# Patient Record
Sex: Female | Born: 1960 | Race: Black or African American | Hispanic: No | Marital: Single
Health system: Southern US, Community
[De-identification: ages and names within clinical notes are randomized; demographics above are authoritative.]

---

## 1999-01-07 ENCOUNTER — Ambulatory Visit (HOSPITAL_COMMUNITY): Admission: RE | Admit: 1999-01-07 | Discharge: 1999-01-07 | Payer: Self-pay | Admitting: Obstetrics and Gynecology

## 1999-01-07 ENCOUNTER — Encounter: Payer: Self-pay | Admitting: Obstetrics and Gynecology

## 1999-01-20 ENCOUNTER — Other Ambulatory Visit: Admission: RE | Admit: 1999-01-20 | Discharge: 1999-01-20 | Payer: Self-pay | Admitting: Obstetrics and Gynecology

## 2000-01-20 ENCOUNTER — Ambulatory Visit (HOSPITAL_COMMUNITY): Admission: RE | Admit: 2000-01-20 | Discharge: 2000-01-20 | Payer: Self-pay | Admitting: Obstetrics and Gynecology

## 2000-01-20 ENCOUNTER — Encounter: Payer: Self-pay | Admitting: Obstetrics and Gynecology

## 2000-01-20 ENCOUNTER — Other Ambulatory Visit: Admission: RE | Admit: 2000-01-20 | Discharge: 2000-01-20 | Payer: Self-pay | Admitting: Obstetrics and Gynecology

## 2001-01-20 ENCOUNTER — Other Ambulatory Visit: Admission: RE | Admit: 2001-01-20 | Discharge: 2001-01-20 | Payer: Self-pay | Admitting: *Deleted

## 2001-01-20 ENCOUNTER — Encounter: Payer: Self-pay | Admitting: Obstetrics and Gynecology

## 2001-01-20 ENCOUNTER — Ambulatory Visit (HOSPITAL_COMMUNITY): Admission: RE | Admit: 2001-01-20 | Discharge: 2001-01-20 | Payer: Self-pay | Admitting: Obstetrics and Gynecology

## 2001-07-04 ENCOUNTER — Emergency Department (HOSPITAL_COMMUNITY): Admission: EM | Admit: 2001-07-04 | Discharge: 2001-07-04 | Payer: Self-pay | Admitting: Emergency Medicine

## 2002-01-20 ENCOUNTER — Other Ambulatory Visit: Admission: RE | Admit: 2002-01-20 | Discharge: 2002-01-20 | Payer: Self-pay | Admitting: Obstetrics and Gynecology

## 2002-01-20 ENCOUNTER — Ambulatory Visit (HOSPITAL_COMMUNITY): Admission: RE | Admit: 2002-01-20 | Discharge: 2002-01-20 | Payer: Self-pay | Admitting: Obstetrics and Gynecology

## 2002-01-20 ENCOUNTER — Encounter: Payer: Self-pay | Admitting: Obstetrics and Gynecology

## 2003-01-22 ENCOUNTER — Encounter: Payer: Self-pay | Admitting: Obstetrics and Gynecology

## 2003-01-22 ENCOUNTER — Ambulatory Visit (HOSPITAL_COMMUNITY): Admission: RE | Admit: 2003-01-22 | Discharge: 2003-01-22 | Payer: Self-pay | Admitting: Obstetrics and Gynecology

## 2003-01-29 ENCOUNTER — Other Ambulatory Visit: Admission: RE | Admit: 2003-01-29 | Discharge: 2003-01-29 | Payer: Self-pay | Admitting: Obstetrics and Gynecology

## 2004-01-21 ENCOUNTER — Other Ambulatory Visit: Admission: RE | Admit: 2004-01-21 | Discharge: 2004-01-21 | Payer: Self-pay | Admitting: Obstetrics & Gynecology

## 2004-01-25 ENCOUNTER — Ambulatory Visit (HOSPITAL_COMMUNITY): Admission: RE | Admit: 2004-01-25 | Discharge: 2004-01-25 | Payer: Self-pay | Admitting: Obstetrics and Gynecology

## 2004-02-12 ENCOUNTER — Encounter: Admission: RE | Admit: 2004-02-12 | Discharge: 2004-02-12 | Payer: Self-pay | Admitting: Obstetrics and Gynecology

## 2005-01-27 ENCOUNTER — Ambulatory Visit (HOSPITAL_COMMUNITY): Admission: RE | Admit: 2005-01-27 | Discharge: 2005-01-27 | Payer: Self-pay | Admitting: Obstetrics and Gynecology

## 2006-01-28 ENCOUNTER — Ambulatory Visit (HOSPITAL_COMMUNITY): Admission: RE | Admit: 2006-01-28 | Discharge: 2006-01-28 | Payer: Self-pay | Admitting: Obstetrics and Gynecology

## 2007-02-02 ENCOUNTER — Ambulatory Visit (HOSPITAL_COMMUNITY): Admission: RE | Admit: 2007-02-02 | Discharge: 2007-02-02 | Payer: Self-pay | Admitting: Obstetrics and Gynecology

## 2008-02-03 ENCOUNTER — Ambulatory Visit (HOSPITAL_COMMUNITY): Admission: RE | Admit: 2008-02-03 | Discharge: 2008-02-03 | Payer: Self-pay | Admitting: Obstetrics and Gynecology

## 2008-02-03 IMAGING — MG MM DIGITAL SCREENING BILAT
4 series · 4 of 4 positions shown · non-contrast
Comparison: none

DG SCREEN MAMMOGRAM BILATERAL
Bilateral CC and MLO view(s) were taken.

DIGITAL SCREENING MAMMOGRAM WITH CAD:
There are scattered fibroglandular densities.  No masses or malignant type calcifications are 
identified.  Compared with prior studies.

[R CC]
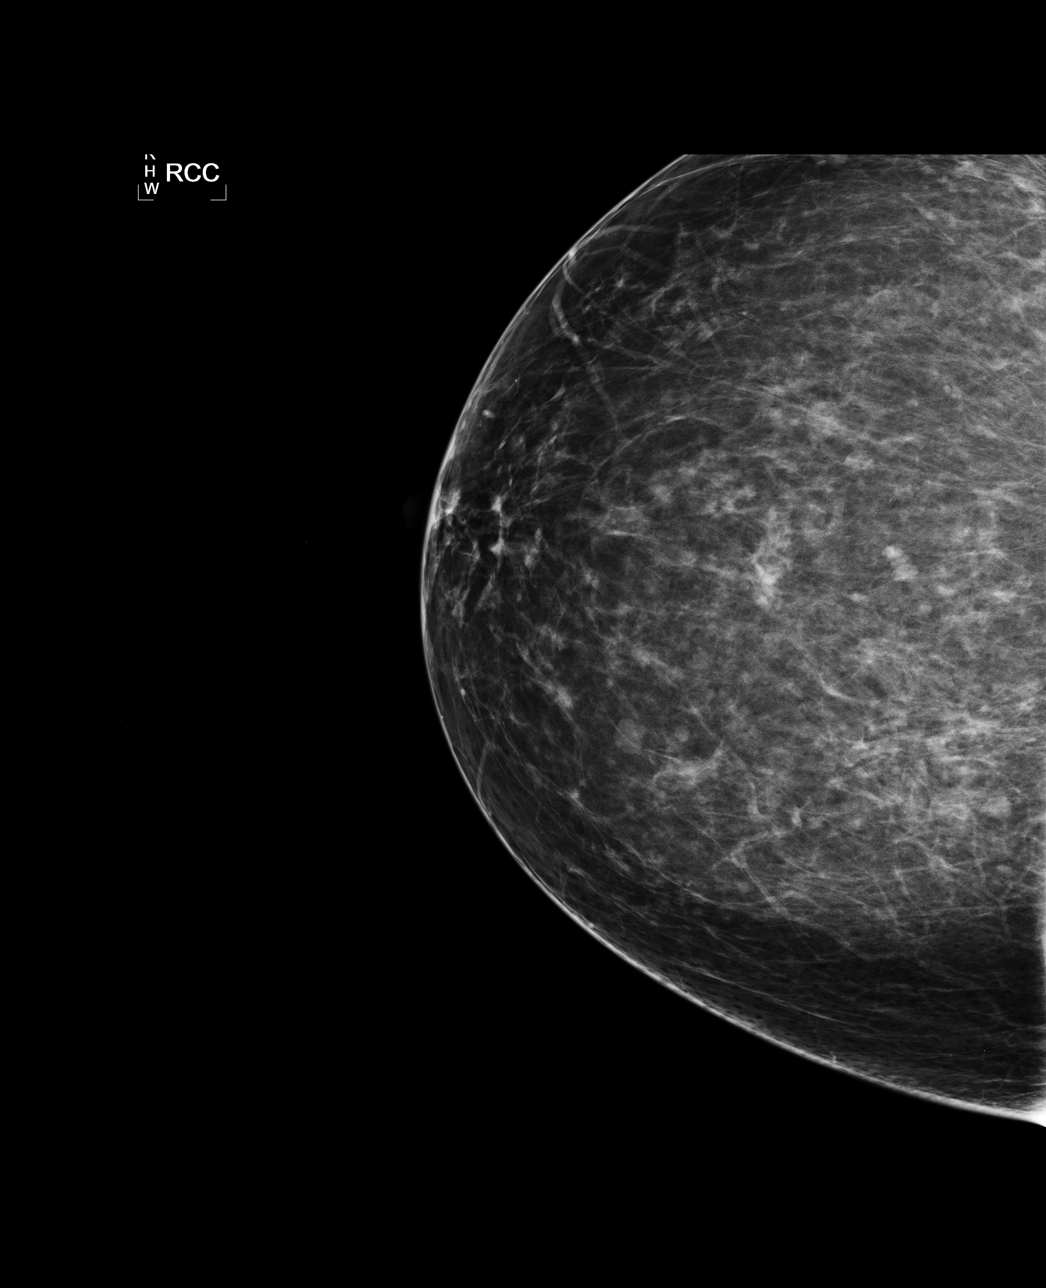

[R MLO]
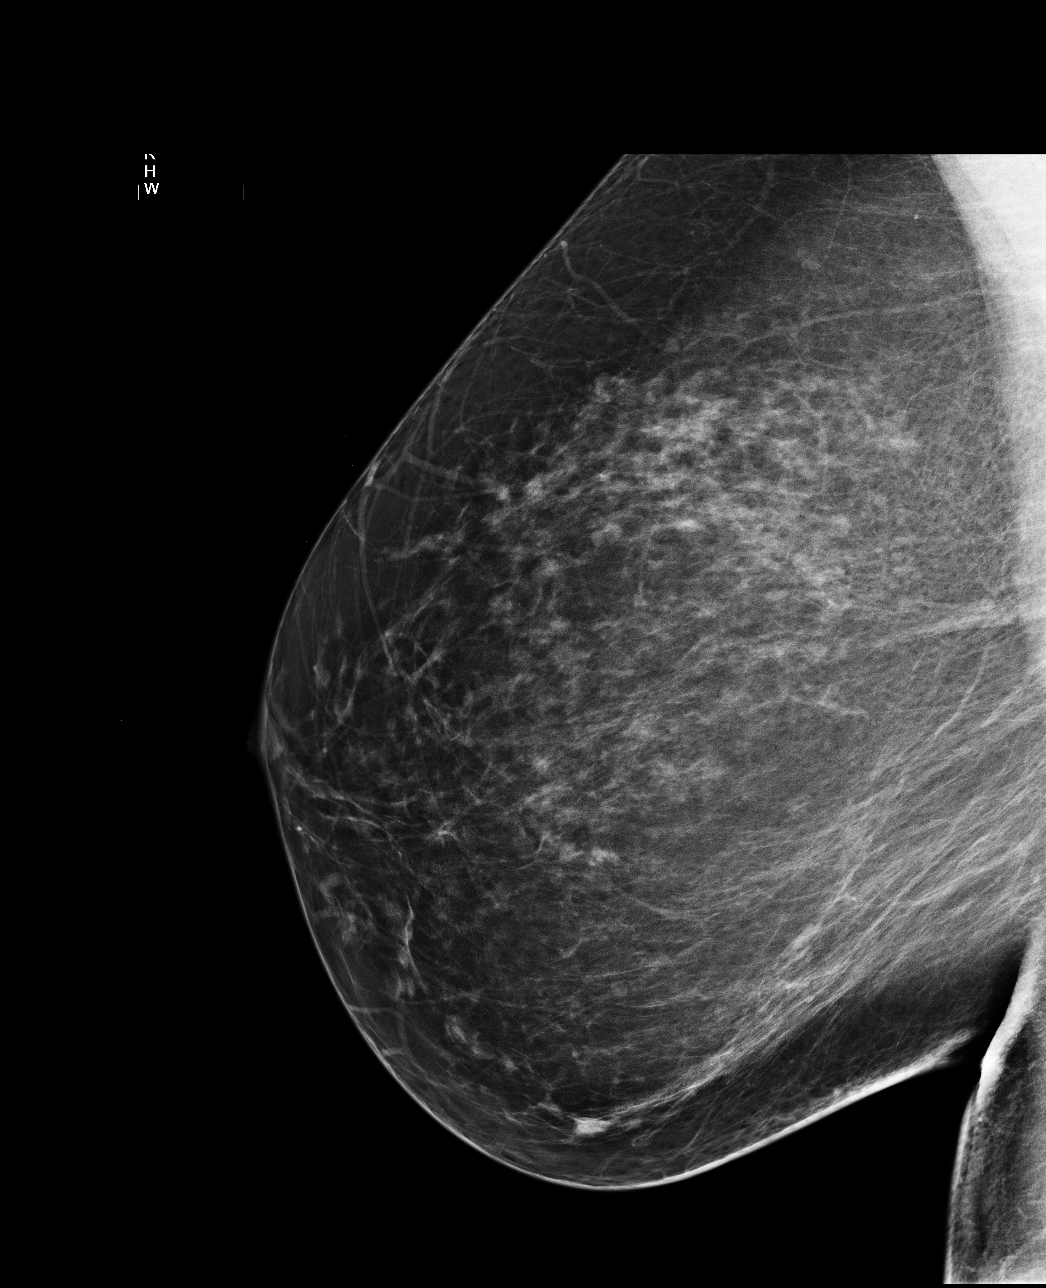

[L CC]
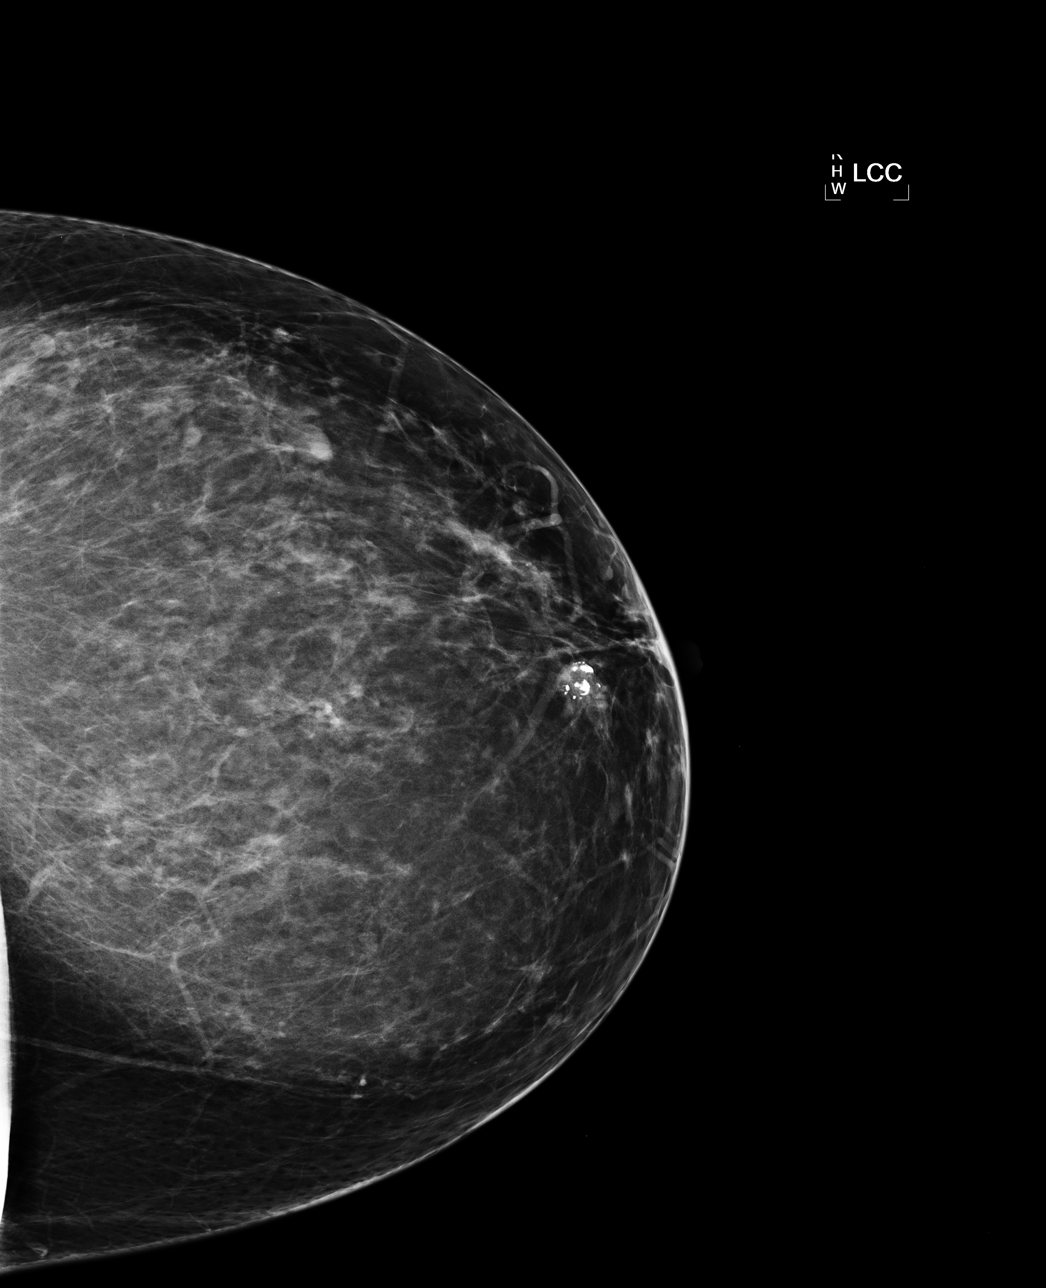

[L MLO]
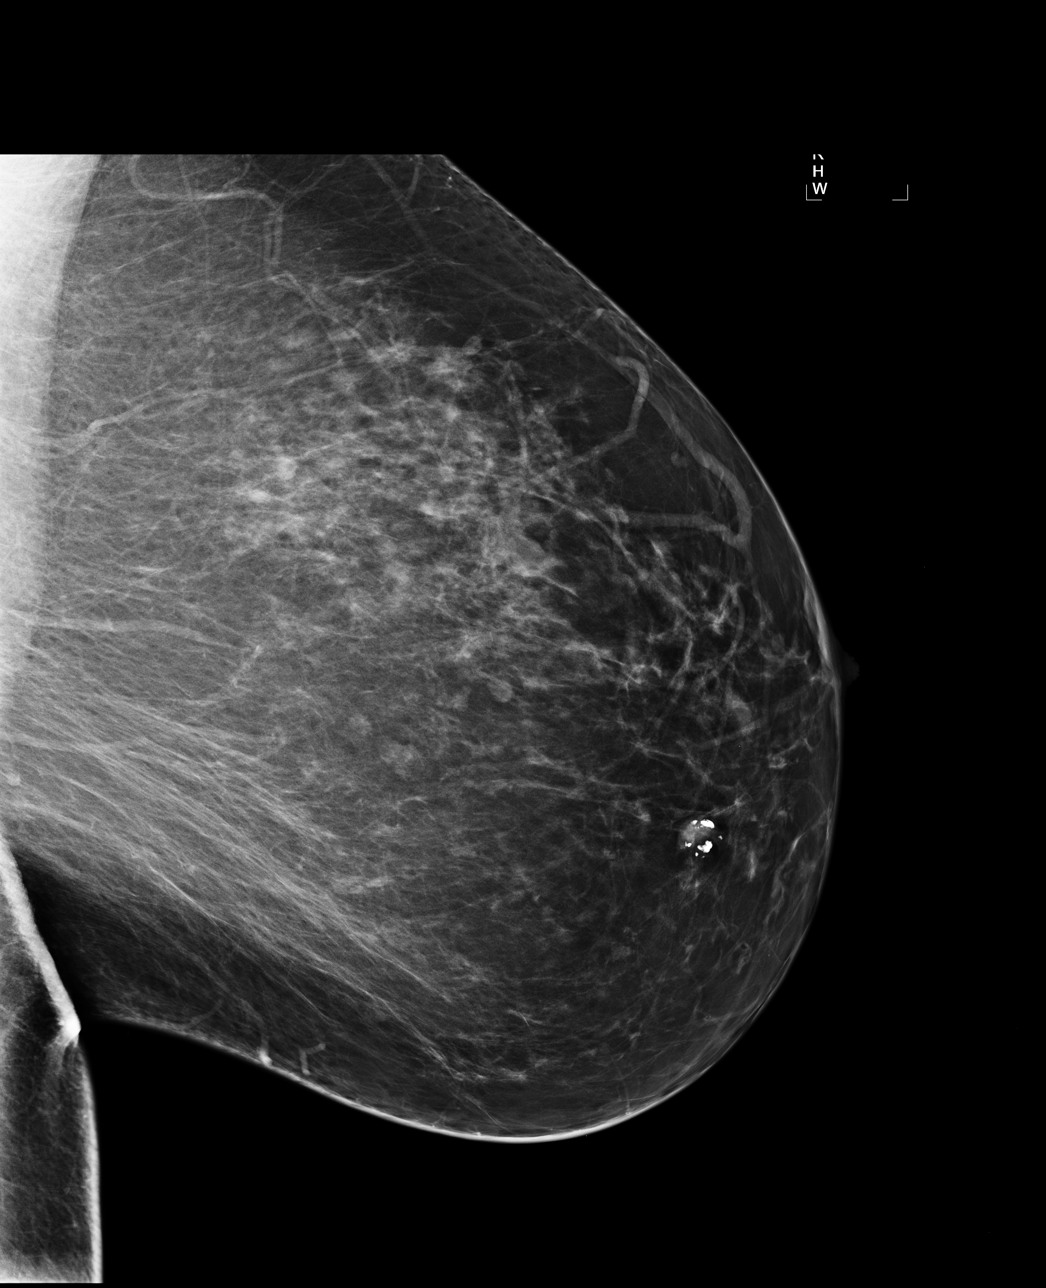

[4 of 4 positions shown; findings below may reference images not displayed]

IMPRESSION: No specific mammographic evidence of malignancy.  Next screening mammogram is recommended in one 
year.

ASSESSMENT: Negative - BI-RADS 1

Screening mammogram in 1 year.
ANALYZED BY COMPUTER AIDED DETECTION. , THIS PROCEDURE WAS A DIGITAL MAMMOGRAM.

## 2009-02-05 ENCOUNTER — Ambulatory Visit (HOSPITAL_COMMUNITY): Admission: RE | Admit: 2009-02-05 | Discharge: 2009-02-05 | Payer: Self-pay | Admitting: Obstetrics and Gynecology

## 2009-02-12 ENCOUNTER — Encounter: Admission: RE | Admit: 2009-02-12 | Discharge: 2009-02-12 | Payer: Self-pay | Admitting: Obstetrics and Gynecology

## 2009-08-09 ENCOUNTER — Encounter: Admission: RE | Admit: 2009-08-09 | Discharge: 2009-08-09 | Payer: Self-pay | Admitting: Obstetrics and Gynecology

## 2010-02-10 ENCOUNTER — Encounter: Admission: RE | Admit: 2010-02-10 | Discharge: 2010-02-10 | Payer: Self-pay | Admitting: Obstetrics and Gynecology

## 2010-03-26 ENCOUNTER — Encounter: Admission: RE | Admit: 2010-03-26 | Discharge: 2010-03-26 | Payer: Self-pay | Admitting: Obstetrics and Gynecology

## 2011-01-13 ENCOUNTER — Other Ambulatory Visit: Payer: Self-pay | Admitting: Obstetrics and Gynecology

## 2011-01-13 DIAGNOSIS — Z1231 Encounter for screening mammogram for malignant neoplasm of breast: Secondary | ICD-10-CM

## 2011-02-13 ENCOUNTER — Ambulatory Visit
Admission: RE | Admit: 2011-02-13 | Discharge: 2011-02-13 | Disposition: A | Discharge status: Home / Self Care | Payer: 59 | Source: Ambulatory Visit | Attending: Obstetrics and Gynecology | Admitting: Obstetrics and Gynecology

## 2011-02-13 DIAGNOSIS — Z1231 Encounter for screening mammogram for malignant neoplasm of breast: Secondary | ICD-10-CM

## 2012-02-02 ENCOUNTER — Other Ambulatory Visit: Payer: Self-pay | Admitting: Obstetrics and Gynecology

## 2012-02-02 DIAGNOSIS — Z1231 Encounter for screening mammogram for malignant neoplasm of breast: Secondary | ICD-10-CM

## 2012-02-24 ENCOUNTER — Ambulatory Visit
Admission: RE | Admit: 2012-02-24 | Discharge: 2012-02-24 | Disposition: A | Discharge status: Home / Self Care | Payer: 59 | Source: Ambulatory Visit | Attending: Obstetrics and Gynecology | Admitting: Obstetrics and Gynecology

## 2012-02-24 DIAGNOSIS — Z1231 Encounter for screening mammogram for malignant neoplasm of breast: Secondary | ICD-10-CM

## 2013-01-25 ENCOUNTER — Other Ambulatory Visit: Payer: Self-pay

## 2013-01-25 DIAGNOSIS — Z1231 Encounter for screening mammogram for malignant neoplasm of breast: Secondary | ICD-10-CM

## 2013-02-28 ENCOUNTER — Ambulatory Visit
Admission: RE | Admit: 2013-02-28 | Discharge: 2013-02-28 | Disposition: A | Discharge status: Home / Self Care | Payer: 59 | Source: Ambulatory Visit

## 2013-02-28 DIAGNOSIS — Z1231 Encounter for screening mammogram for malignant neoplasm of breast: Secondary | ICD-10-CM

## 2014-02-12 ENCOUNTER — Other Ambulatory Visit: Payer: Self-pay

## 2014-02-12 DIAGNOSIS — Z1231 Encounter for screening mammogram for malignant neoplasm of breast: Secondary | ICD-10-CM

## 2014-03-01 ENCOUNTER — Ambulatory Visit: Payer: 59

## 2014-03-08 ENCOUNTER — Ambulatory Visit
Admission: RE | Admit: 2014-03-08 | Discharge: 2014-03-08 | Disposition: A | Discharge status: Home / Self Care | Payer: 59 | Source: Ambulatory Visit

## 2014-03-08 DIAGNOSIS — Z1231 Encounter for screening mammogram for malignant neoplasm of breast: Secondary | ICD-10-CM

## 2015-03-06 ENCOUNTER — Other Ambulatory Visit: Payer: Self-pay

## 2015-03-06 DIAGNOSIS — Z1231 Encounter for screening mammogram for malignant neoplasm of breast: Secondary | ICD-10-CM

## 2015-04-01 ENCOUNTER — Ambulatory Visit
Admission: RE | Admit: 2015-04-01 | Discharge: 2015-04-01 | Disposition: A | Discharge status: Home / Self Care | Payer: 59 | Source: Ambulatory Visit

## 2015-04-01 DIAGNOSIS — Z1231 Encounter for screening mammogram for malignant neoplasm of breast: Secondary | ICD-10-CM

## 2015-04-04 ENCOUNTER — Other Ambulatory Visit: Payer: Self-pay | Admitting: Obstetrics and Gynecology

## 2015-04-04 DIAGNOSIS — M8580 Other specified disorders of bone density and structure, unspecified site: Secondary | ICD-10-CM

## 2015-04-04 DIAGNOSIS — Z780 Asymptomatic menopausal state: Secondary | ICD-10-CM

## 2015-04-12 ENCOUNTER — Ambulatory Visit
Admission: RE | Admit: 2015-04-12 | Discharge: 2015-04-12 | Disposition: A | Discharge status: Home / Self Care | Payer: 59 | Source: Ambulatory Visit | Attending: Obstetrics and Gynecology | Admitting: Obstetrics and Gynecology

## 2015-04-12 DIAGNOSIS — Z780 Asymptomatic menopausal state: Secondary | ICD-10-CM

## 2015-04-12 DIAGNOSIS — M8580 Other specified disorders of bone density and structure, unspecified site: Secondary | ICD-10-CM

## 2016-02-18 ENCOUNTER — Other Ambulatory Visit: Payer: Self-pay | Admitting: Obstetrics and Gynecology

## 2016-02-18 DIAGNOSIS — Z139 Encounter for screening, unspecified: Secondary | ICD-10-CM

## 2016-04-06 ENCOUNTER — Ambulatory Visit
Admission: RE | Admit: 2016-04-06 | Discharge: 2016-04-06 | Disposition: A | Discharge status: Home / Self Care | Payer: 59 | Source: Ambulatory Visit | Attending: Obstetrics and Gynecology | Admitting: Obstetrics and Gynecology

## 2016-04-06 DIAGNOSIS — Z139 Encounter for screening, unspecified: Secondary | ICD-10-CM

## 2017-03-18 ENCOUNTER — Other Ambulatory Visit: Payer: Self-pay | Admitting: Obstetrics and Gynecology

## 2017-03-18 DIAGNOSIS — Z1231 Encounter for screening mammogram for malignant neoplasm of breast: Secondary | ICD-10-CM

## 2017-04-08 ENCOUNTER — Ambulatory Visit: Payer: 59

## 2017-04-13 ENCOUNTER — Ambulatory Visit
Admission: RE | Admit: 2017-04-13 | Discharge: 2017-04-13 | Disposition: A | Discharge status: Home / Self Care | Payer: 59 | Source: Ambulatory Visit | Attending: Obstetrics and Gynecology | Admitting: Obstetrics and Gynecology

## 2017-04-13 DIAGNOSIS — Z1231 Encounter for screening mammogram for malignant neoplasm of breast: Secondary | ICD-10-CM

## 2018-03-08 ENCOUNTER — Other Ambulatory Visit: Payer: Self-pay | Admitting: Obstetrics and Gynecology

## 2018-03-08 DIAGNOSIS — Z1231 Encounter for screening mammogram for malignant neoplasm of breast: Secondary | ICD-10-CM

## 2018-04-21 ENCOUNTER — Ambulatory Visit
Admission: RE | Admit: 2018-04-21 | Discharge: 2018-04-21 | Disposition: A | Discharge status: Home / Self Care | Payer: 59 | Source: Ambulatory Visit | Attending: Obstetrics and Gynecology | Admitting: Obstetrics and Gynecology

## 2018-04-21 DIAGNOSIS — Z1231 Encounter for screening mammogram for malignant neoplasm of breast: Secondary | ICD-10-CM

## 2018-04-26 ENCOUNTER — Other Ambulatory Visit: Payer: Self-pay | Admitting: Obstetrics and Gynecology

## 2018-04-26 DIAGNOSIS — M8580 Other specified disorders of bone density and structure, unspecified site: Secondary | ICD-10-CM

## 2018-06-20 ENCOUNTER — Ambulatory Visit
Admission: RE | Admit: 2018-06-20 | Discharge: 2018-06-20 | Disposition: A | Discharge status: Home / Self Care | Payer: 59 | Source: Ambulatory Visit | Attending: Obstetrics and Gynecology | Admitting: Obstetrics and Gynecology

## 2018-06-20 DIAGNOSIS — M8580 Other specified disorders of bone density and structure, unspecified site: Secondary | ICD-10-CM

## 2019-03-31 ENCOUNTER — Other Ambulatory Visit: Payer: Self-pay | Admitting: Obstetrics and Gynecology

## 2019-03-31 DIAGNOSIS — M8580 Other specified disorders of bone density and structure, unspecified site: Secondary | ICD-10-CM

## 2019-03-31 DIAGNOSIS — Z1231 Encounter for screening mammogram for malignant neoplasm of breast: Secondary | ICD-10-CM

## 2019-05-16 ENCOUNTER — Ambulatory Visit
Admission: RE | Admit: 2019-05-16 | Discharge: 2019-05-16 | Disposition: A | Discharge status: Home / Self Care | Payer: 59 | Source: Ambulatory Visit | Attending: Obstetrics and Gynecology | Admitting: Obstetrics and Gynecology

## 2019-05-16 ENCOUNTER — Other Ambulatory Visit: Payer: Self-pay

## 2019-05-16 DIAGNOSIS — Z1231 Encounter for screening mammogram for malignant neoplasm of breast: Secondary | ICD-10-CM

## 2019-12-16 ENCOUNTER — Ambulatory Visit: Payer: 59 | Attending: Internal Medicine

## 2019-12-16 DIAGNOSIS — Z23 Encounter for immunization: Secondary | ICD-10-CM

## 2019-12-16 NOTE — Progress Notes (Signed)
   Covid-[facility] Vaccination Clinic  Name:  Amy Flynn    MRN: [id number] DOB: 04/28/1961  12/16/2019  Amy Flynn was observed post Covid-19 immunization for 15 minutes without incident. She was provided with Vaccine Information Sheet and instruction to access the V-Safe system.   Amy Flynn was instructed to call 911 with any severe reactions post vaccine: Marland Kitchen Difficulty breathing  . Swelling of face and throat  . A fast heartbeat  . A bad rash all over body  . Dizziness and weakness   Immunizations Administered    Name Date Dose VIS Date Route   Pfizer COVID-19 Vaccine 12/16/2019  3:25 PM 0.3 mL 08/18/2019 Intramuscular   Manufacturer: [employer], [employer]   Lot: [id number]   NDC: [address]-8

## 2020-01-08 ENCOUNTER — Ambulatory Visit: Payer: 59 | Attending: Internal Medicine

## 2020-01-08 DIAGNOSIS — Z23 Encounter for immunization: Secondary | ICD-10-CM

## 2020-01-08 NOTE — Progress Notes (Signed)
   Covid-19 Vaccination Clinic  Name:  Amy Flynn    MRN: [id number] DOB: 1960-10-04  01/08/2020  Amy Flynn was observed post Covid-19 immunization for 15 minutes without incident. She was provided with Vaccine Information Sheet and instruction to access the V-Safe system.   Amy Flynn was instructed to call 911 with any severe reactions post vaccine: Marland Kitchen Difficulty breathing  . Swelling of face and throat  . A fast heartbeat  . A bad rash all over body  . Dizziness and weakness   Immunizations Administered    Name Date Dose VIS Date Route   Pfizer COVID-19 Vaccine 01/08/2020  2:30 PM 0.3 mL 11/01/2018 Intramuscular   Manufacturer: [employer], [employer]   Lot: [id number]   NDC: [address]-4

## 2020-04-01 ENCOUNTER — Other Ambulatory Visit: Payer: Self-pay | Admitting: Obstetrics and Gynecology

## 2020-04-01 DIAGNOSIS — Z1231 Encounter for screening mammogram for malignant neoplasm of breast: Secondary | ICD-10-CM

## 2020-05-06 ENCOUNTER — Other Ambulatory Visit: Payer: Self-pay | Admitting: Obstetrics and Gynecology

## 2020-05-06 DIAGNOSIS — M8580 Other specified disorders of bone density and structure, unspecified site: Secondary | ICD-10-CM

## 2020-05-16 ENCOUNTER — Other Ambulatory Visit: Payer: Self-pay

## 2020-05-16 ENCOUNTER — Ambulatory Visit
Admission: RE | Admit: 2020-05-16 | Discharge: 2020-05-16 | Disposition: A | Discharge status: Home / Self Care | Payer: 59 | Source: Ambulatory Visit | Attending: Obstetrics and Gynecology | Admitting: Obstetrics and Gynecology

## 2020-05-16 DIAGNOSIS — Z1231 Encounter for screening mammogram for malignant neoplasm of breast: Secondary | ICD-10-CM

## 2020-08-22 ENCOUNTER — Other Ambulatory Visit: Payer: Self-pay

## 2020-08-22 ENCOUNTER — Ambulatory Visit
Admission: RE | Admit: 2020-08-22 | Discharge: 2020-08-22 | Disposition: A | Discharge status: Home / Self Care | Payer: 59 | Source: Ambulatory Visit | Attending: Obstetrics and Gynecology | Admitting: Obstetrics and Gynecology

## 2020-08-22 DIAGNOSIS — M8580 Other specified disorders of bone density and structure, unspecified site: Secondary | ICD-10-CM
